# Patient Record
Sex: Female | Born: 2000 | Race: White | Hispanic: No | Marital: Single | Smoking: Never smoker
Health system: Southern US, Community
[De-identification: ages and names within clinical notes are randomized; demographics above are authoritative.]

---
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
## 2023-03-12 IMAGING — CT CT ABD-PELV W/ CM
2 of 4 series · 15 of 46 positions shown, 17 images · IV contrast (APPLIED)
Comparison: CT abdomen/pelvis 03/10/2020

CLINICAL DATA: Right lower quadrant pain

EXAM:
CT ABDOMEN AND PELVIS WITH CONTRAST
TECHNIQUE: Multidetector CT imaging of the abdomen and pelvis was performed
using the standard protocol following bolus administration of
intravenous contrast.
CONTRAST:  100mL OMNIPAQUE IOHEXOL 300 MG/ML  SOLN

[Series 2: axial st · axial · 0.69mm/px · z∈[-618,-142]mm · 12 of 105 slices shown, 14 images]
[im 5/105  soft-tissue]
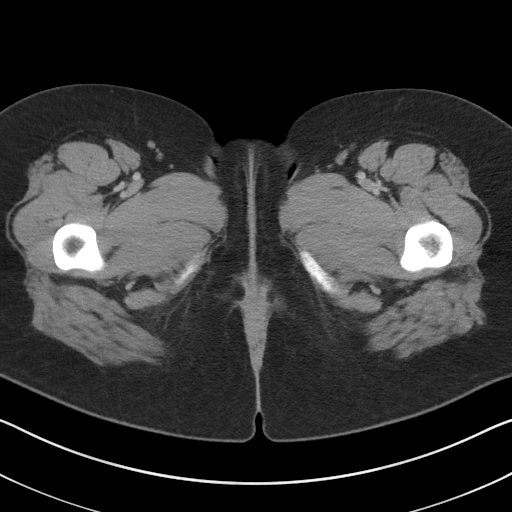
[im 5/105  bone]
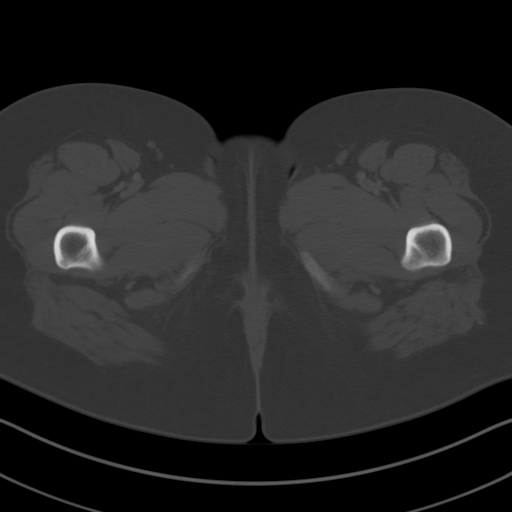
[im 14/105  soft-tissue]
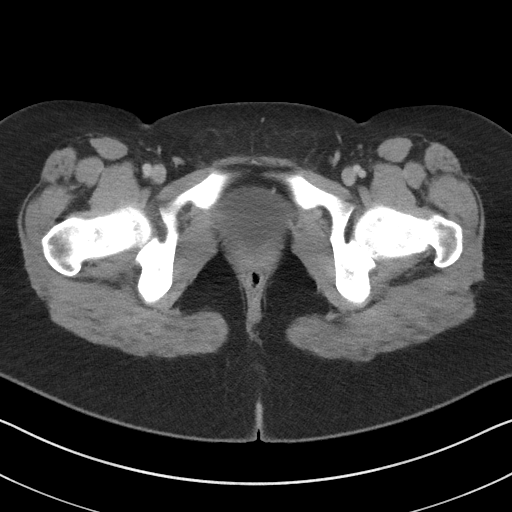
[im 22/105  soft-tissue]
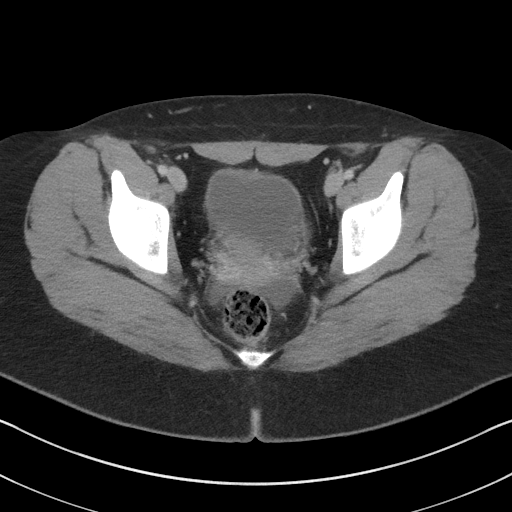
[im 31/105  soft-tissue]
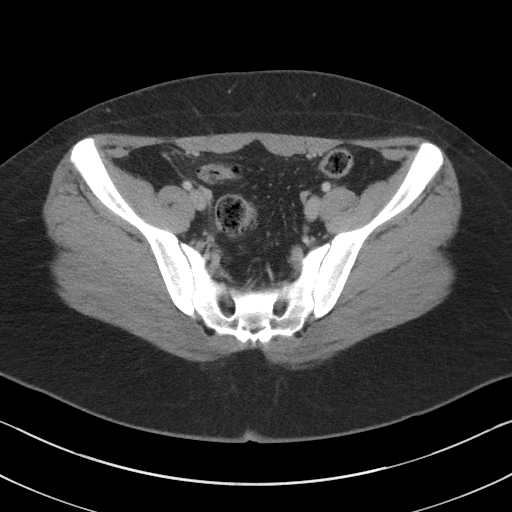
[im 40/105  soft-tissue]
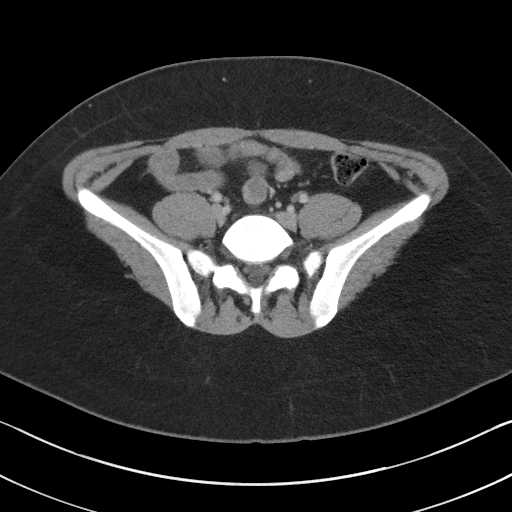
[im 48/105  soft-tissue]
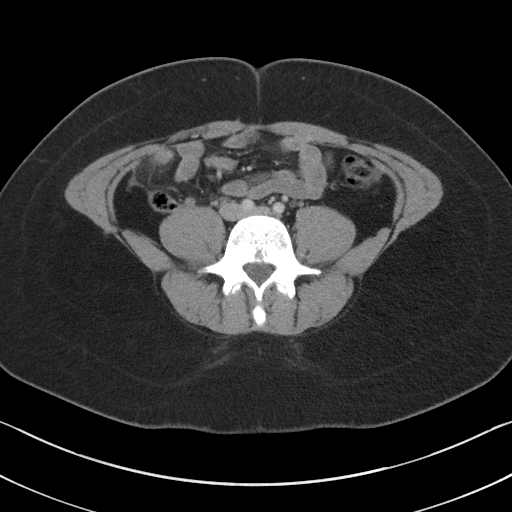
[im 57/105  soft-tissue]
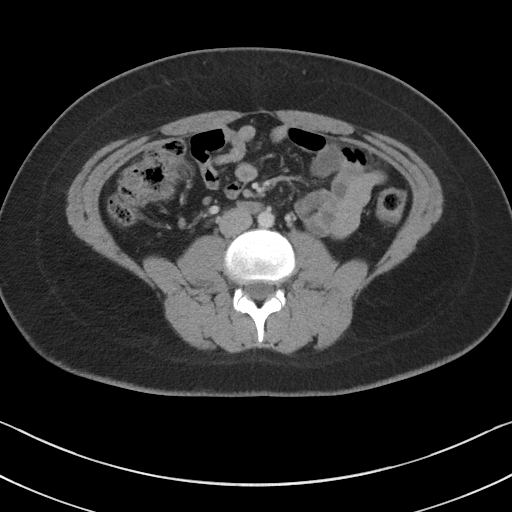
[im 66/105  soft-tissue]
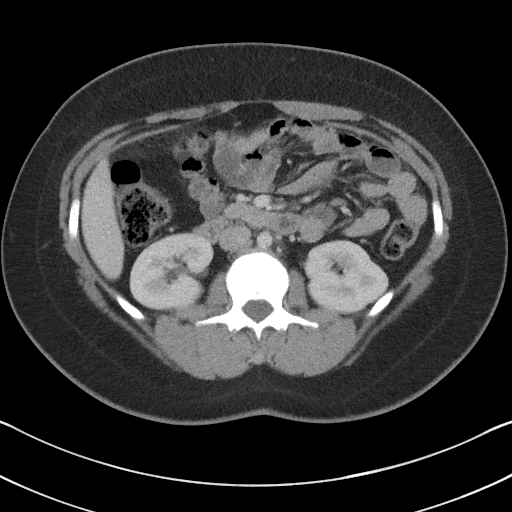
[im 74/105  soft-tissue]
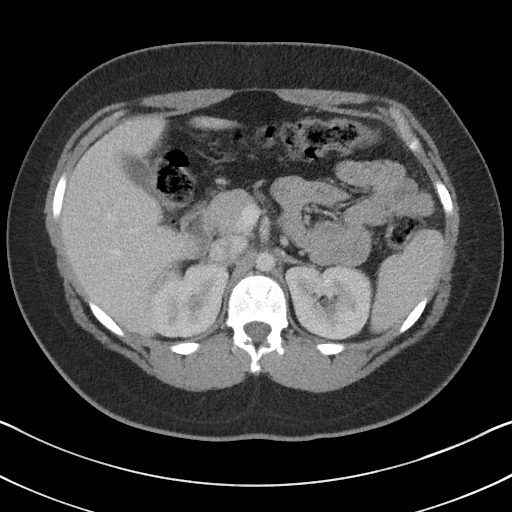
[im 74/105  bone]
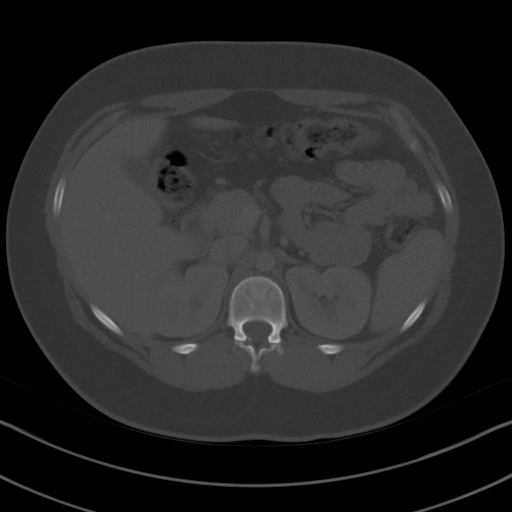
[im 83/105  soft-tissue]
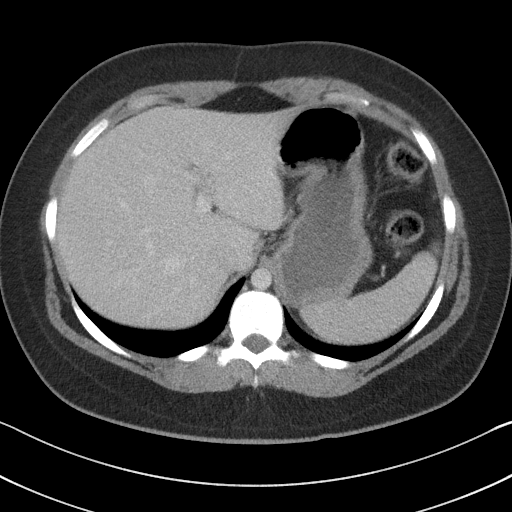
[im 92/105  soft-tissue]
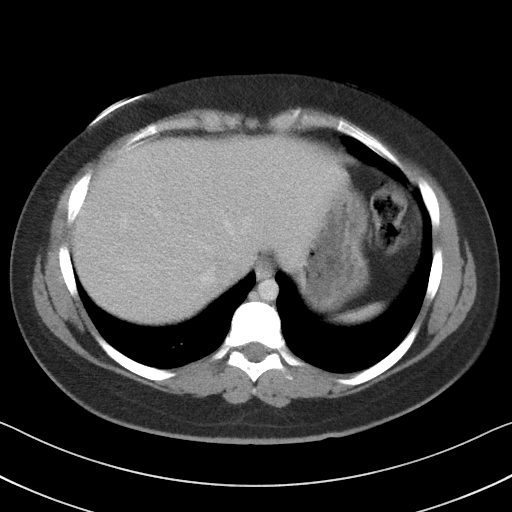
[im 100/105  soft-tissue]
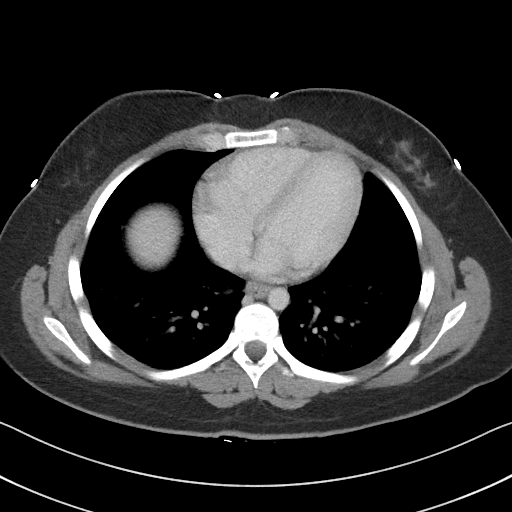

[Series 5: coronal st · coronal · 0.67mm/px · 3 of 84 slices shown]
[im 28/84  soft-tissue]
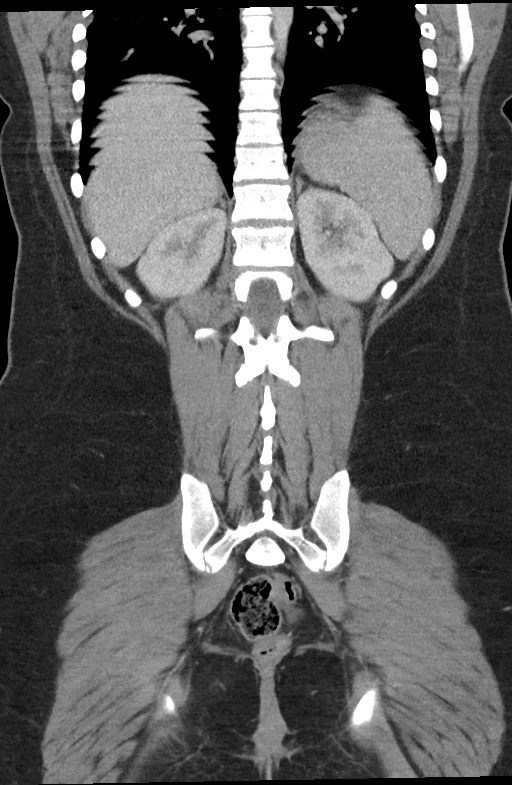
[im 37/84  soft-tissue]
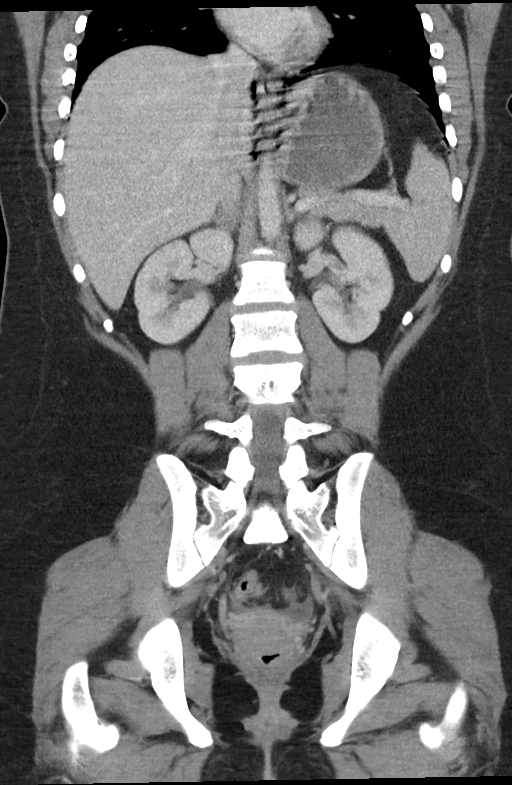
[im 47/84  soft-tissue]
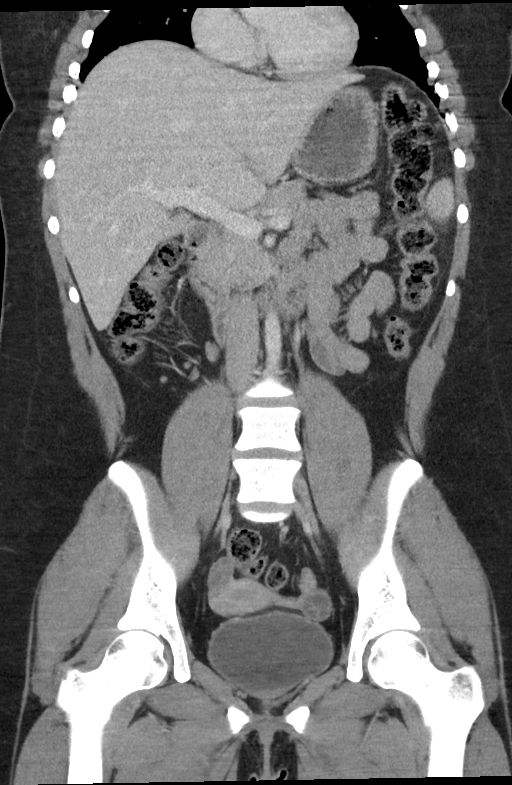

[15 of 46 positions shown; findings below may reference images not displayed]

FINDINGS: Lower chest: No acute abnormality.

Hepatobiliary: No focal liver abnormality is seen. No gallstones,
gallbladder wall thickening, or biliary dilatation.

Pancreas: Unremarkable. No pancreatic ductal dilatation or
surrounding inflammatory changes.

Spleen: Normal in size without focal abnormality.

Adrenals/Urinary Tract: Adrenal glands are unremarkable. Kidneys are
normal, without renal calculi, focal lesion, or hydronephrosis.
Bladder is unremarkable.

Stomach/Bowel: Stomach is within normal limits. Appendix appears
normal. No evidence of bowel wall thickening, distention, or
inflammatory changes.

Vascular/Lymphatic: No significant vascular findings are present. No
enlarged abdominal or pelvic lymph nodes.

Reproductive: Uterus and bilateral adnexa are unremarkable.

Other: No abdominal wall hernia or abnormality. A small volume free
fluid in the anatomic pelvis is likely physiologic.

Musculoskeletal: No acute or significant osseous findings.
IMPRESSION: 1. No acute abnormality within the abdomen or pelvis. Specifically,
normal appendix.
2. Small volume free fluid in the anatomic pelvis is likely
physiologic and may signify recent ovulation.

## 2023-04-18 IMAGING — CT CT NECK W/ CM
3 of 4 series · 11 of 33 positions shown, 13 images · IV contrast (Omnipaque)
Comparison: None available.

CLINICAL DATA: Initial evaluation for acute tonsillar swelling.

EXAM:
CT NECK WITH CONTRAST
TECHNIQUE: Multidetector CT imaging of the neck was performed using the
standard protocol following the bolus administration of intravenous
contrast.
CONTRAST:  85mL OMNIPAQUE IOHEXOL 350 MG/ML SOLN

[Series 6: sag neck · sagittal · 0.49mm/px · 5 of 85 slices shown, 6 images]
[im 29/85  bone]
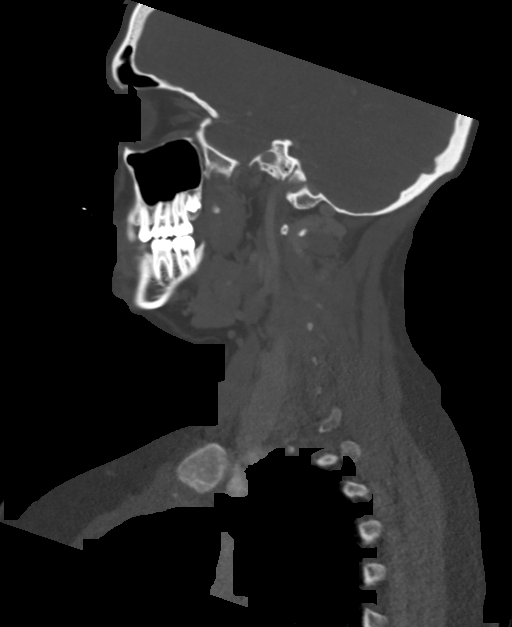
[im 36/85  bone]
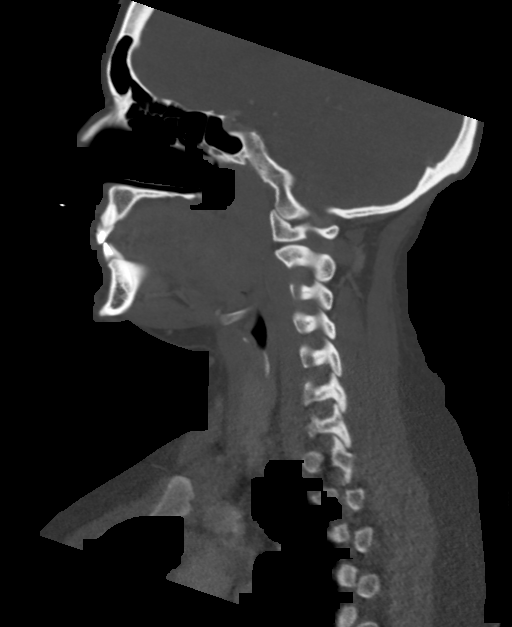
[im 43/85  soft-tissue]
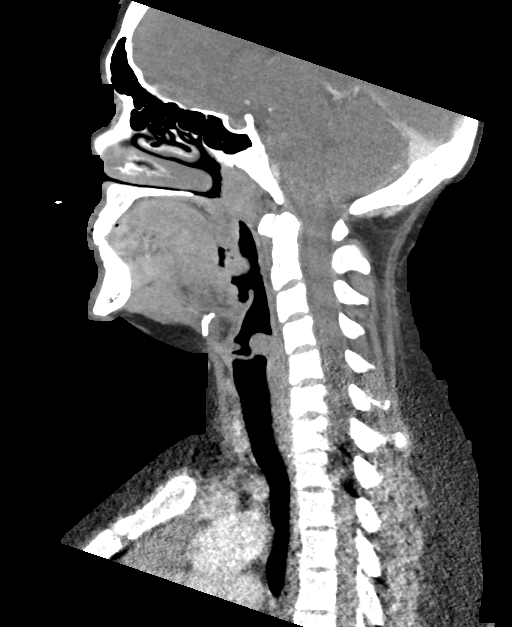
[im 43/85  bone]
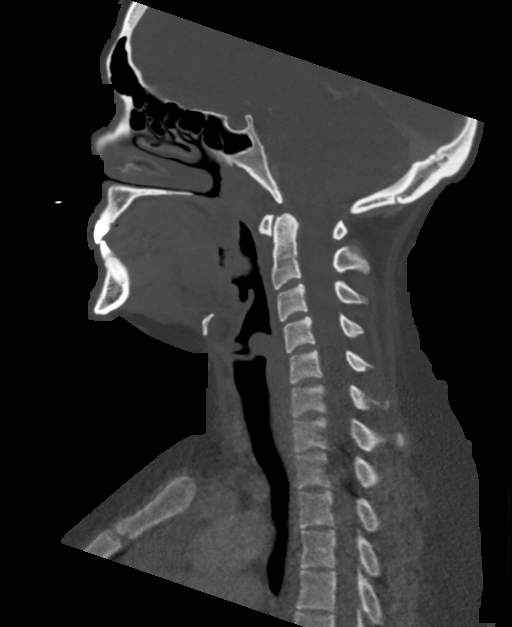
[im 50/85  bone]
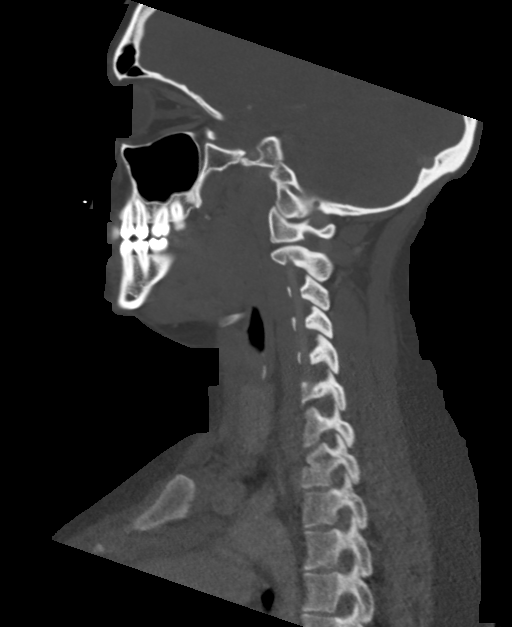
[im 57/85  bone]
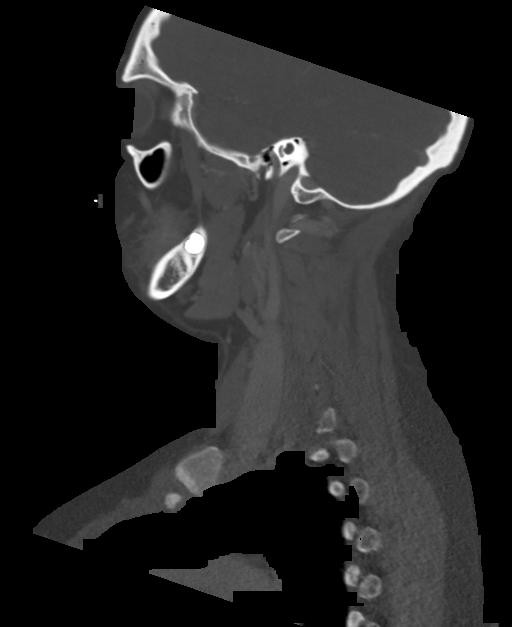

[Series 7: cor neck · coronal · 0.33mm/px · 3 of 119 slices shown]
[im 37/119  bone]
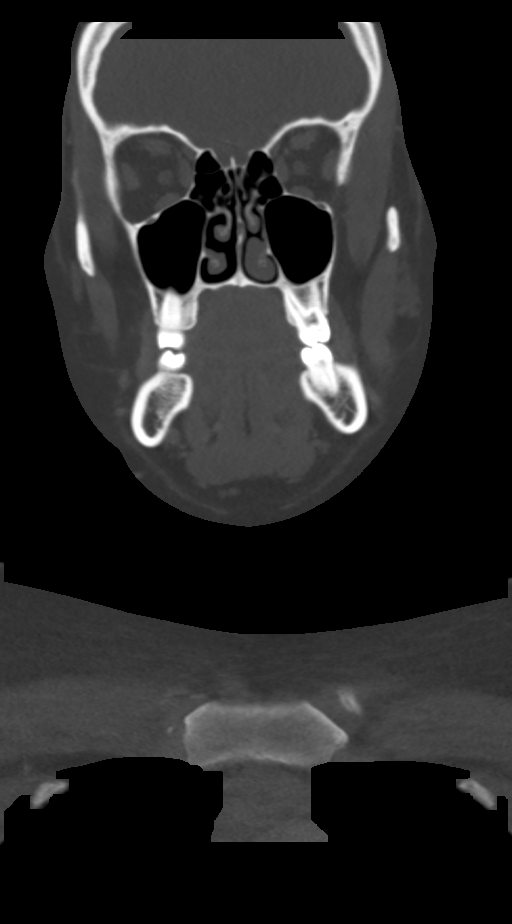
[im 52/119  bone]
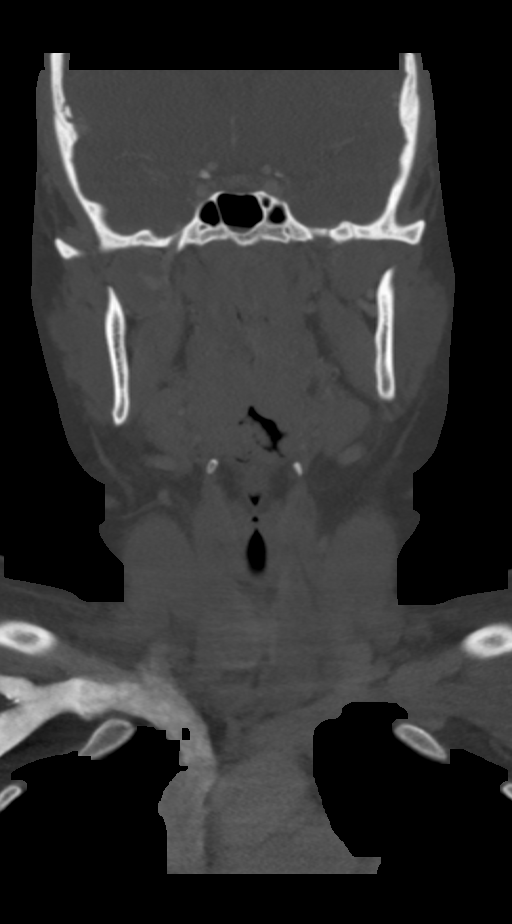
[im 67/119  bone]
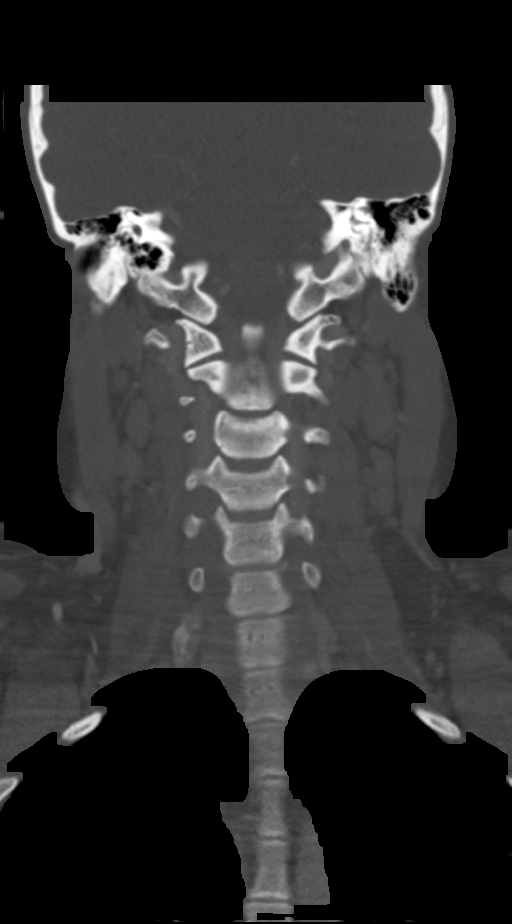

[Series 8: ax oropharynx · axial · 0.33mm/px · z∈[-513,-348]mm · 3 of 153 slices shown, 4 images]
[im 44/153  soft-tissue]
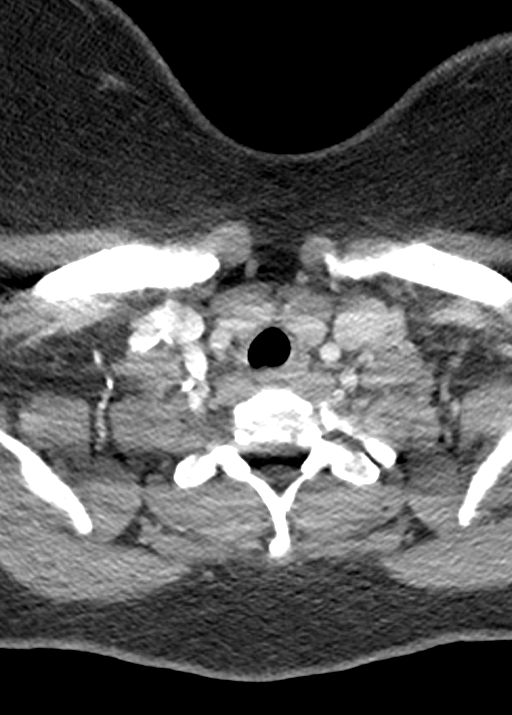
[im 44/153  bone]
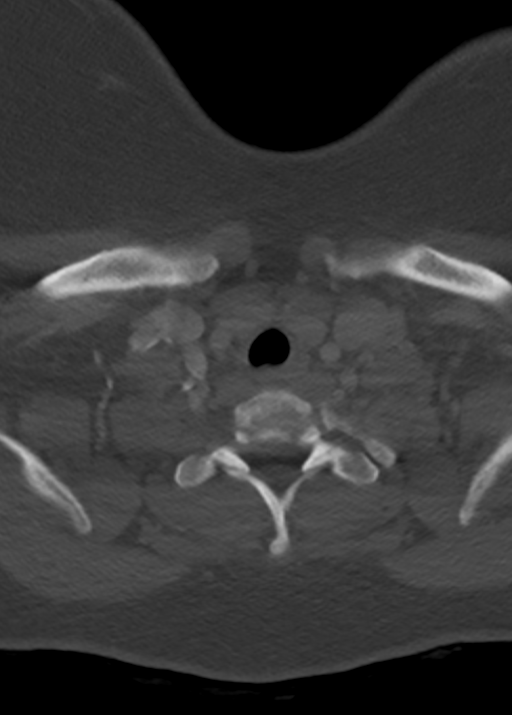
[im 87/153  bone]
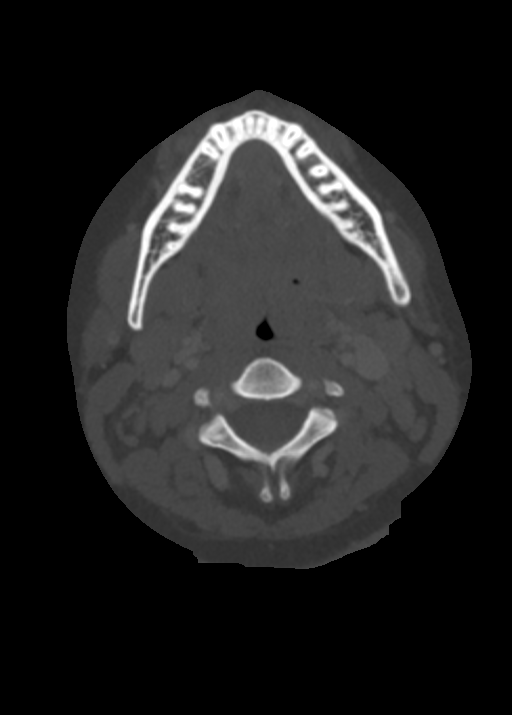
[im 131/153  bone]
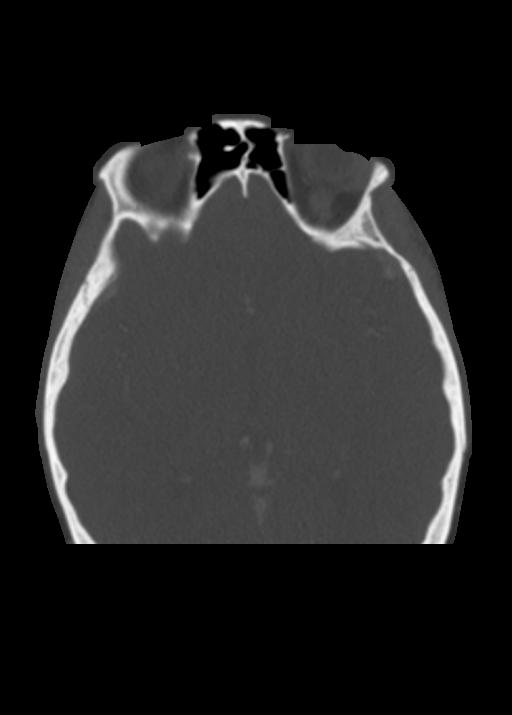

[11 of 33 positions shown; findings below may reference images not displayed]

FINDINGS: Pharynx and larynx: Oral cavity within normal limits. No acute
abnormality about the dentition. Palatine tonsils are enlarged and
hypertrophied bilaterally, nonspecific, but could reflect acute
tonsillitis. No discrete tonsillar or peritonsillar abscess.
Adenoidal soft tissues prominent and hypertrophied as well.
Retropharyngeal soft tissues within normal limits. Epiglottis
normal. Vallecula clear. Remainder of the hypopharynx and
supraglottic larynx within normal limits. Glottis normal. Subglottic
airway patent clear.

Salivary glands: Salivary glands including the parotid and
submandibular glands are normal.

Thyroid: Normal.

Lymph nodes: Prominent bilateral level II lymph nodes measure up to
1.2 cm on the right and 1.2 cm on the left, likely reactive. No
other enlarged or pathologic adenopathy.

Vascular: Normal intravascular enhancement seen throughout the neck.

Limited intracranial: Unremarkable.

Visualized orbits: Unremarkable.

Mastoids and visualized paranasal sinuses: Paranasal sinuses are
clear. Mastoid air cells and middle ear cavities are well
pneumatized and free of fluid.

Skeleton: No visible acute osseous finding. No discrete or worrisome
osseous lesions.

Upper chest: Visualized upper chest demonstrates no acute finding.
Partially visualized lungs are clear.

Other: None.
IMPRESSION: 1. Enlarged and hypertrophied palatine tonsils and adenoidal soft
tissues, nonspecific, but could reflect acute tonsillitis. No
discrete tonsillar or peritonsillar abscess.
2. Prominent bilateral level II lymph nodes, likely reactive.
